# Patient Record
Sex: Female | Born: 1950 | Race: White | Hispanic: No | Smoking: Never smoker
Health system: Southern US, Community
[De-identification: ages and names within clinical notes are randomized; demographics above are authoritative.]

## PROBLEM LIST (undated history)

## (undated) DIAGNOSIS — D259 Leiomyoma of uterus, unspecified: Secondary | ICD-10-CM

## (undated) DIAGNOSIS — I10 Essential (primary) hypertension: Secondary | ICD-10-CM

## (undated) DIAGNOSIS — E039 Hypothyroidism, unspecified: Secondary | ICD-10-CM

## (undated) DIAGNOSIS — A630 Anogenital (venereal) warts: Secondary | ICD-10-CM

## (undated) DIAGNOSIS — R5383 Other fatigue: Secondary | ICD-10-CM

## (undated) DIAGNOSIS — E119 Type 2 diabetes mellitus without complications: Secondary | ICD-10-CM

## (undated) DIAGNOSIS — G4730 Sleep apnea, unspecified: Secondary | ICD-10-CM

## (undated) DIAGNOSIS — F988 Other specified behavioral and emotional disorders with onset usually occurring in childhood and adolescence: Secondary | ICD-10-CM

## (undated) HISTORY — DX: Essential (primary) hypertension: I10

## (undated) HISTORY — DX: Hypothyroidism, unspecified: E03.9

## (undated) HISTORY — DX: Other fatigue: R53.83

## (undated) HISTORY — DX: Sleep apnea, unspecified: G47.30

## (undated) HISTORY — DX: Type 2 diabetes mellitus without complications: E11.9

## (undated) HISTORY — DX: Other specified behavioral and emotional disorders with onset usually occurring in childhood and adolescence: F98.8

## (undated) HISTORY — PX: GALLBLADDER SURGERY: SHX652

## (undated) HISTORY — DX: Anogenital (venereal) warts: A63.0

## (undated) HISTORY — DX: Leiomyoma of uterus, unspecified: D25.9

---

## 1992-07-14 HISTORY — PX: MYOMECTOMY: SHX85

## 2001-01-22 ENCOUNTER — Other Ambulatory Visit: Admission: RE | Admit: 2001-01-22 | Discharge: 2001-01-22 | Payer: Self-pay | Admitting: Obstetrics and Gynecology

## 2002-01-24 ENCOUNTER — Other Ambulatory Visit: Admission: RE | Admit: 2002-01-24 | Discharge: 2002-01-24 | Payer: Self-pay | Admitting: Obstetrics and Gynecology

## 2003-01-27 ENCOUNTER — Other Ambulatory Visit: Admission: RE | Admit: 2003-01-27 | Discharge: 2003-01-27 | Payer: Self-pay | Admitting: Obstetrics and Gynecology

## 2005-02-26 ENCOUNTER — Other Ambulatory Visit: Admission: RE | Admit: 2005-02-26 | Discharge: 2005-02-26 | Payer: Self-pay | Admitting: Obstetrics and Gynecology

## 2006-10-22 ENCOUNTER — Ambulatory Visit (HOSPITAL_BASED_OUTPATIENT_CLINIC_OR_DEPARTMENT_OTHER): Admission: RE | Admit: 2006-10-22 | Discharge: 2006-10-22 | Payer: Self-pay | Admitting: Allergy

## 2006-10-25 ENCOUNTER — Ambulatory Visit: Payer: Self-pay | Admitting: Internal Medicine

## 2006-12-29 ENCOUNTER — Ambulatory Visit: Payer: Self-pay | Admitting: Internal Medicine

## 2007-01-26 ENCOUNTER — Ambulatory Visit (HOSPITAL_COMMUNITY): Admission: RE | Admit: 2007-01-26 | Discharge: 2007-01-26 | Payer: Self-pay | Admitting: Allergy

## 2007-02-09 ENCOUNTER — Ambulatory Visit: Payer: Self-pay | Admitting: Internal Medicine

## 2008-11-23 ENCOUNTER — Ambulatory Visit: Payer: Self-pay | Admitting: Family Medicine

## 2008-11-23 DIAGNOSIS — R5383 Other fatigue: Secondary | ICD-10-CM

## 2008-11-23 DIAGNOSIS — R5381 Other malaise: Secondary | ICD-10-CM

## 2008-12-19 ENCOUNTER — Encounter: Payer: Self-pay | Admitting: Family Medicine

## 2008-12-19 LAB — CONVERTED CEMR LAB
ALT: 27 units/L (ref 0–35)
BUN: 18 mg/dL (ref 6–23)
Basophils Relative: 1 % (ref 0–1)
CO2: 22 meq/L (ref 19–32)
Calcium: 9.3 mg/dL (ref 8.4–10.5)
Chloride: 104 meq/L (ref 96–112)
Cholesterol, target level: 200 mg/dL
Cholesterol: 204 mg/dL — ABNORMAL HIGH (ref 0–200)
Creatinine, Ser: 0.83 mg/dL (ref 0.40–1.20)
Eosinophils Absolute: 0.2 10*3/uL (ref 0.0–0.7)
Eosinophils Relative: 4 % (ref 0–5)
HCT: 40.7 % (ref 36.0–46.0)
HDL goal, serum: 40 mg/dL
HDL: 53 mg/dL (ref >39)
Iron: 124 ug/dL (ref 42–145)
LDL Goal: 160 mg/dL
Lymphs Abs: 2.3 10*3/uL (ref 0.7–4.0)
MCHC: 33.2 g/dL (ref 30.0–36.0)
MCV: 93.1 fL (ref 78.0–100.0)
Monocytes Relative: 9 % (ref 3–12)
Neutrophils Relative %: 43 % (ref 43–77)
Platelets: 267 10*3/uL (ref 150–400)
RBC: 4.37 M/uL (ref 3.87–5.11)
Total Bilirubin: 0.7 mg/dL (ref 0.3–1.2)
Total CHOL/HDL Ratio: 3.8
Triglycerides: 151 mg/dL — ABNORMAL HIGH (ref <150)
UIBC: 325 ug/dL
VLDL: 30 mg/dL (ref 0–40)
Vitamin B-12: >2000 pg/mL — ABNORMAL HIGH (ref 211–911)

## 2008-12-26 ENCOUNTER — Encounter: Payer: Self-pay | Admitting: Family Medicine

## 2009-01-02 ENCOUNTER — Encounter: Payer: Self-pay | Admitting: Family Medicine

## 2009-01-03 ENCOUNTER — Encounter: Payer: Self-pay | Admitting: Family Medicine

## 2009-01-04 ENCOUNTER — Encounter: Payer: Self-pay | Admitting: Family Medicine

## 2009-01-12 IMAGING — US US ABDOMEN COMPLETE
1 series · 14 of 25 positions shown · non-contrast
Comparison: none

CLINICAL DATA: Increased liver enzymes.  
ABDOMEN ULTRASOUND:
TECHNIQUE: Complete abdominal ultrasound examination was performed including evaluation of the liver, gallbladder, bile ducts, pancreas, kidneys, spleen, IVC, and abdominal aorta.

[Series 1: us abdomen complete · 0.29mm/px · 14 of 88 slices shown]
[im 1/88]
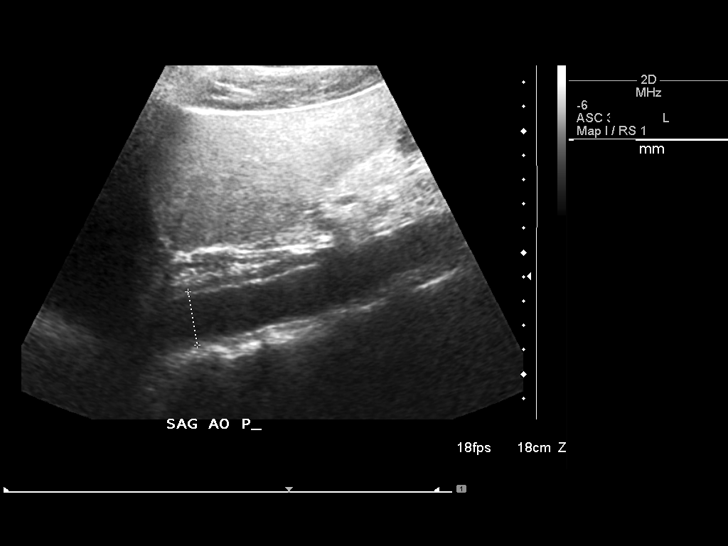
[im 8/88]
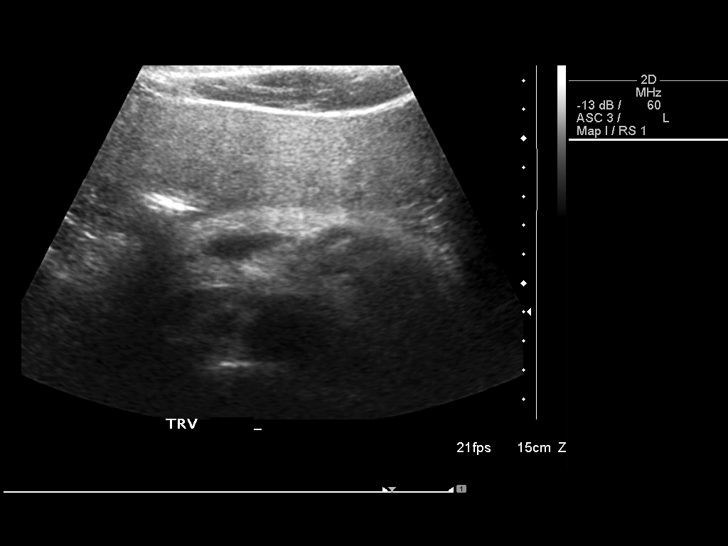
[im 15/88]
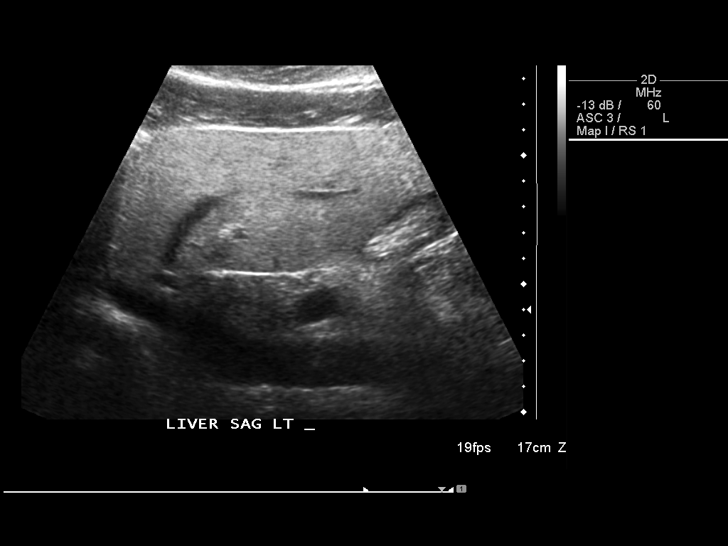
[im 22/88]
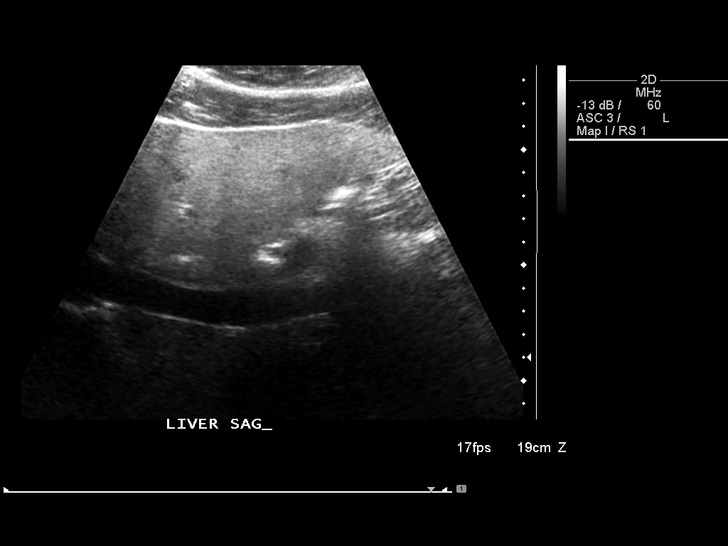
[im 30/88]
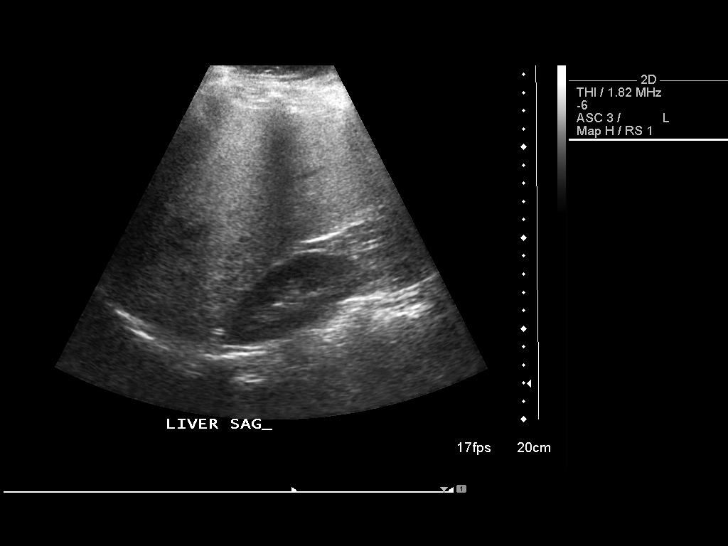
[im 33/88]
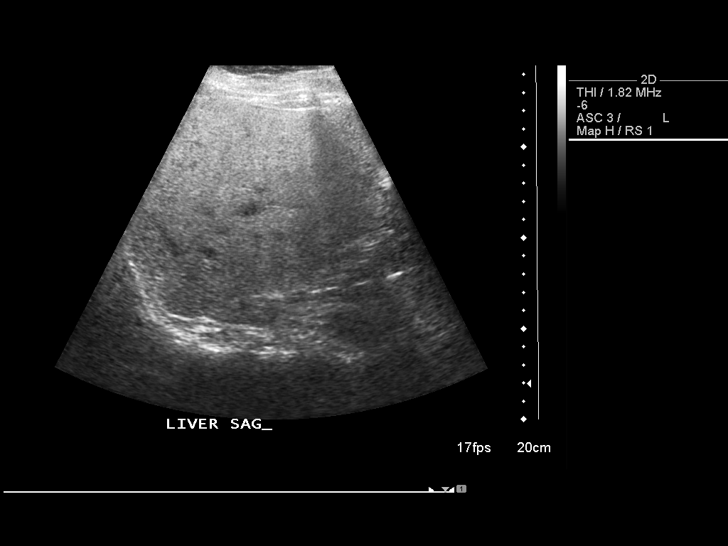
[im 40/88]
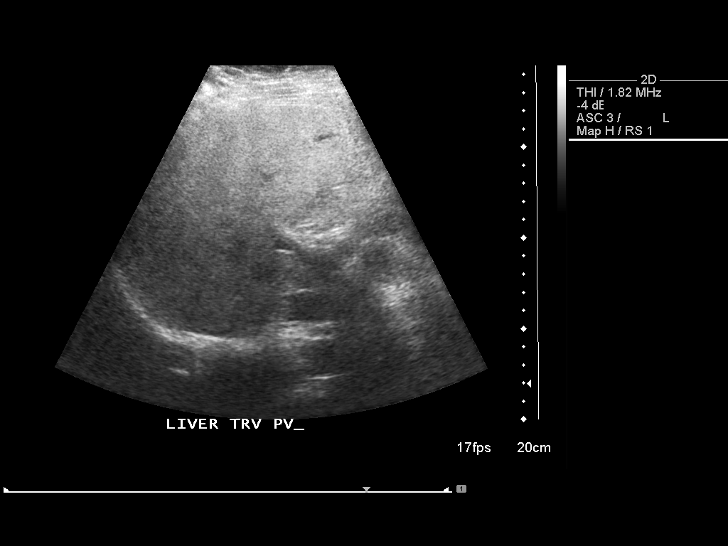
[im 48/88]
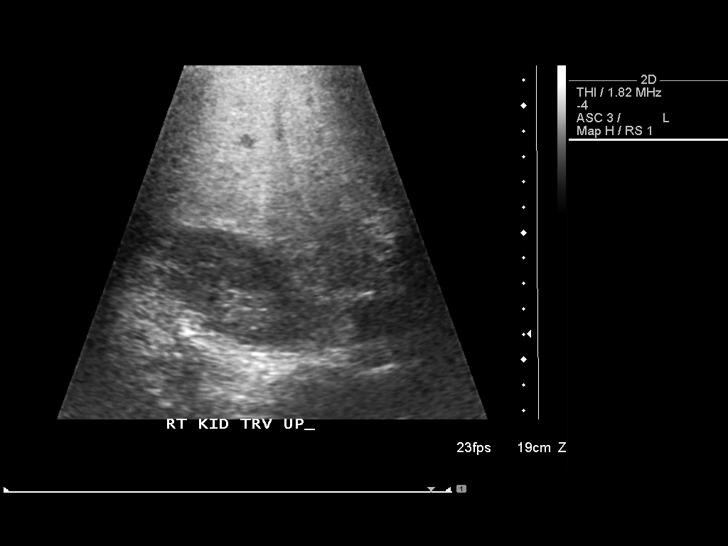
[im 55/88]
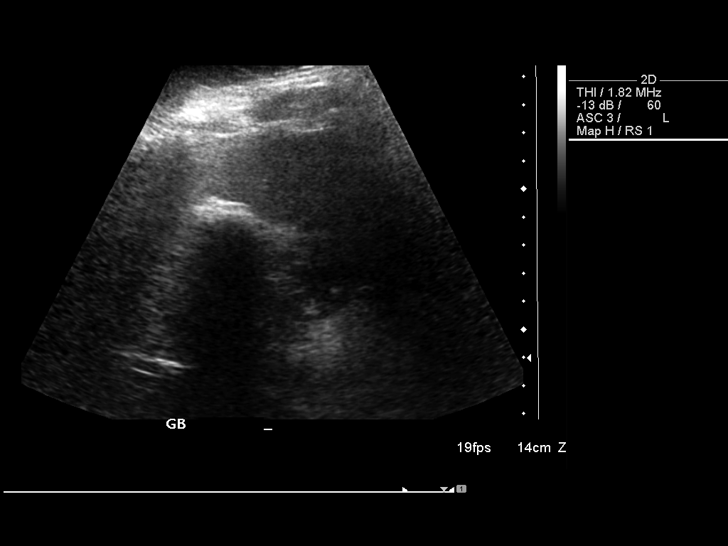
[im 59/88]
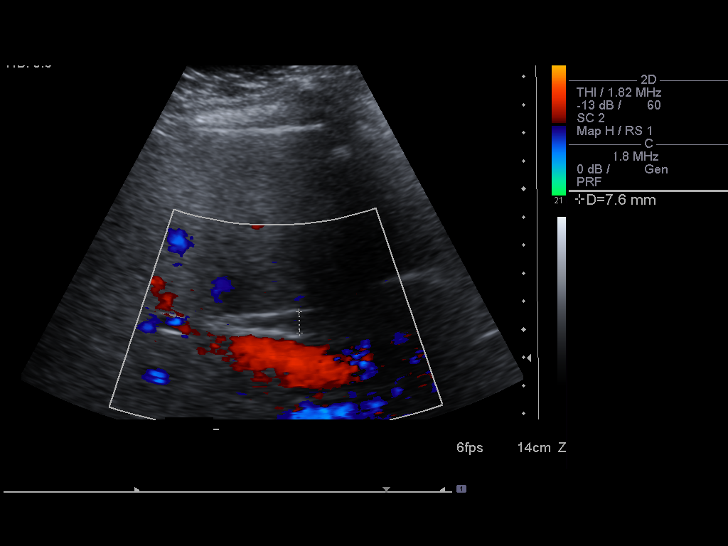
[im 66/88]
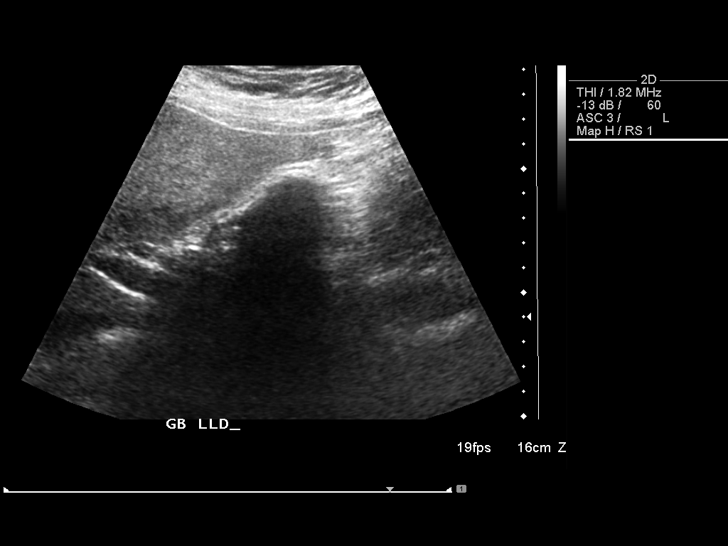
[im 73/88]
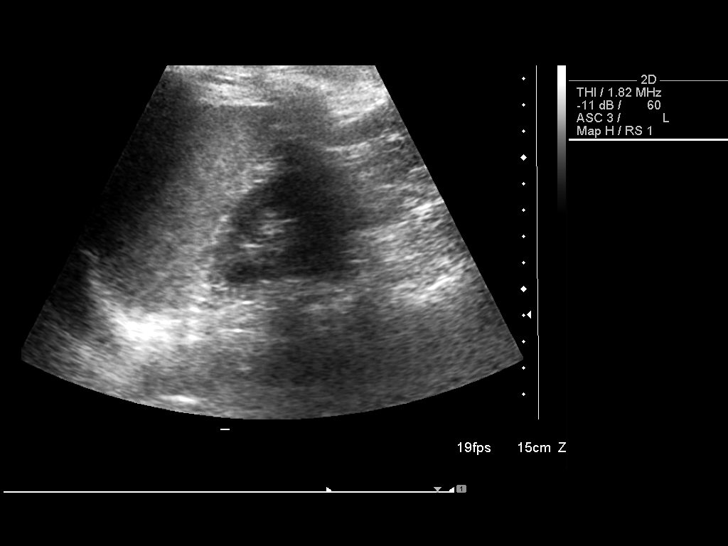
[im 80/88]
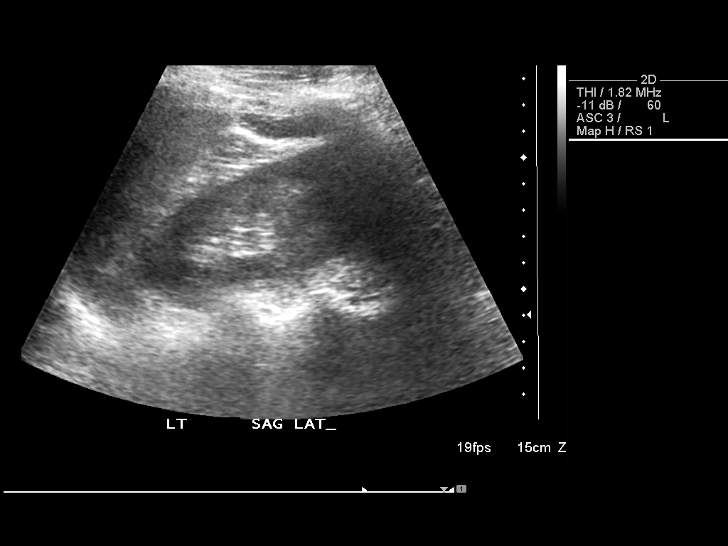
[im 88/88]
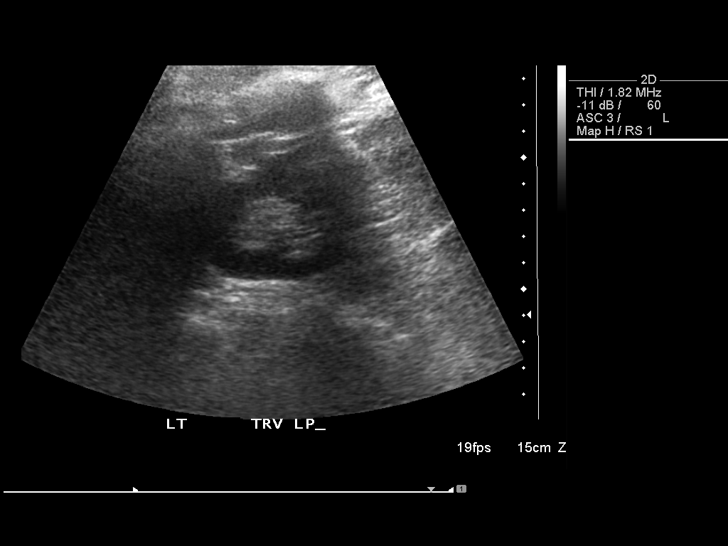

[14 of 25 positions shown; findings below may reference images not displayed]

FINDINGS: The gallbladder wall is echogenic, and there is pronounced posterior acoustic shadowing which is consistent with either a porcelain gallbladder or gallbladder filled with large calculi.  The common duct is at the very upper limits of normal measuring 7-8 mm in width.  No gross intrahepatic biliary ductal dilatation is seen.  The liver is diffusely echogenic, most consistent with fatty infiltration.  The visualized portions of the pancreas, abdominal aorta and IVC are unremarkable.  The spleen and both kidneys have a normal sonographic appearance.  There is no ascites.
IMPRESSION: 1.  ?Wall-echo-shadow? sign involving the gallbladder, consistent with porcelain gallbladder vs gallbladder lumen packed with large stones.  
2.  The common duct is at the very upper limits of normal in width, measuring 7-8 mm.
3.  Diffuse fatty infiltration of the liver.

## 2009-01-12 IMAGING — US US PELVIS COMPLETE MODIFY
1 series · 14 of 25 positions shown · non-contrast
Comparison: none

CLINICAL DATA: Fibroids.  History of myomectomy.  
 TRANSABDOMINAL AND TRANSVAGINAL PELVIC ULTRASOUND:
TECHNIQUE: Both transabdominal and transvaginal ultrasound examinations of the pelvis were performed including evaluation of the uterus, ovaries, adnexal regions, and pelvic cul-de-sac.

[Series 1: us pelvis complete modify · 0.31mm/px · 14 of 31 slices shown]
[im 1/31]
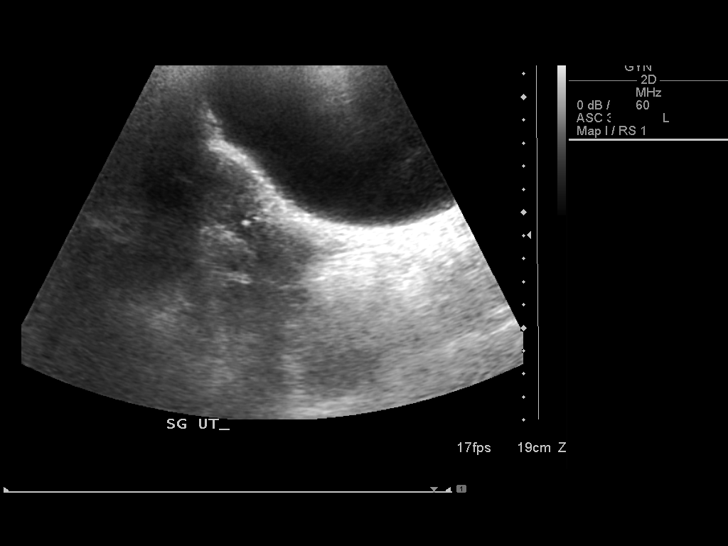
[im 3/31]
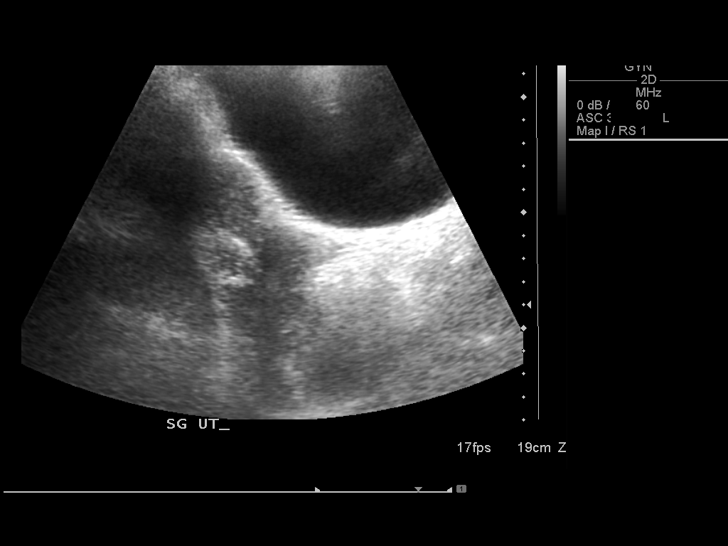
[im 6/31]
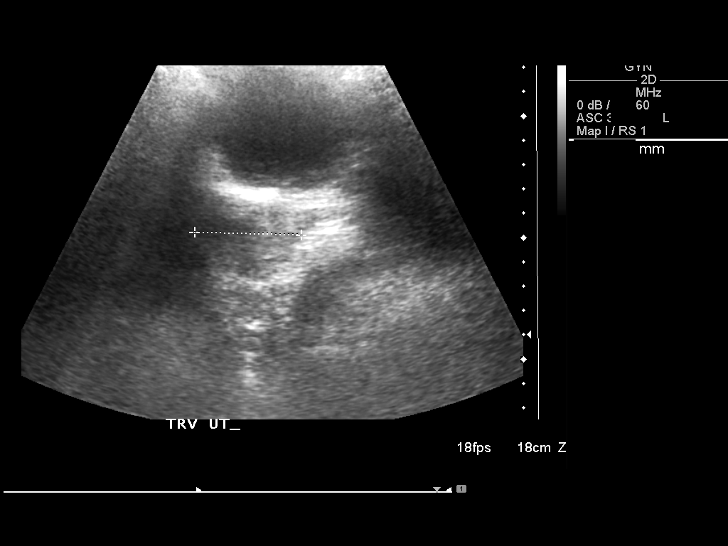
[im 8/31]
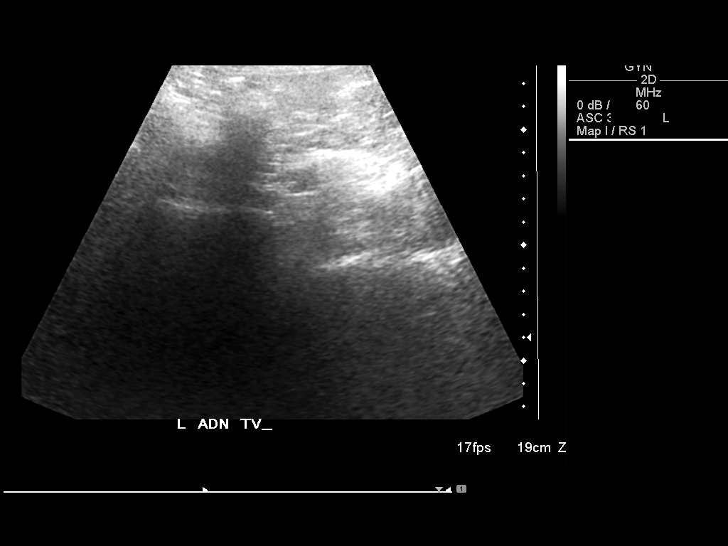
[im 11/31]
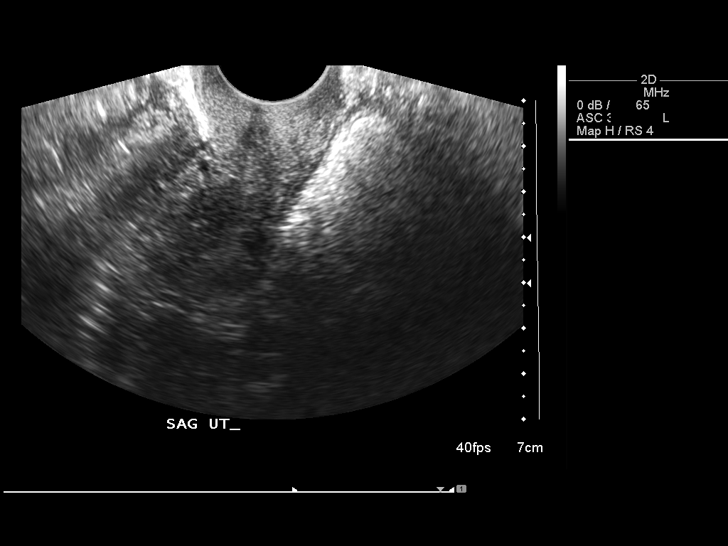
[im 12/31]
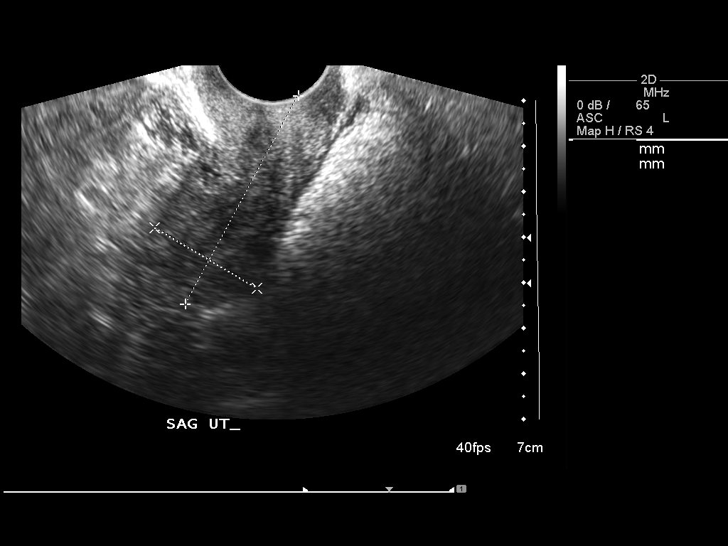
[im 14/31]
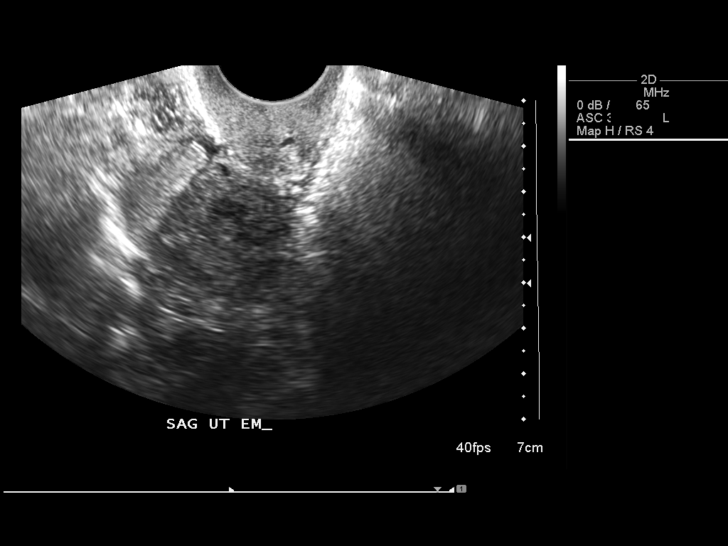
[im 17/31]
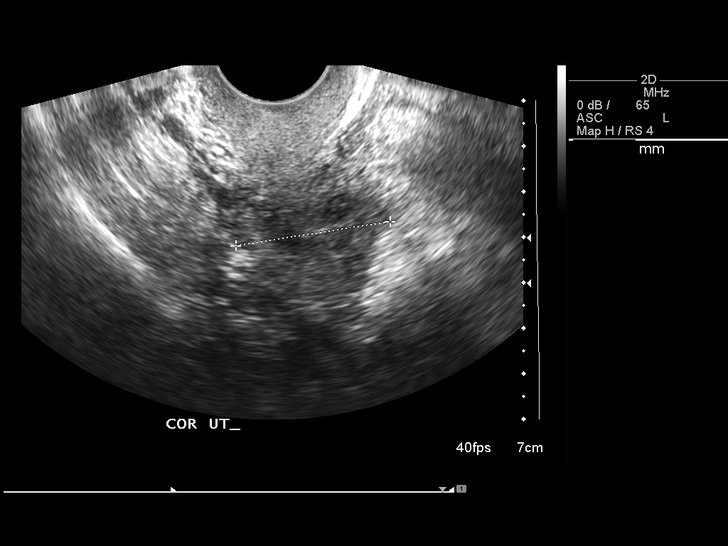
[im 19/31]
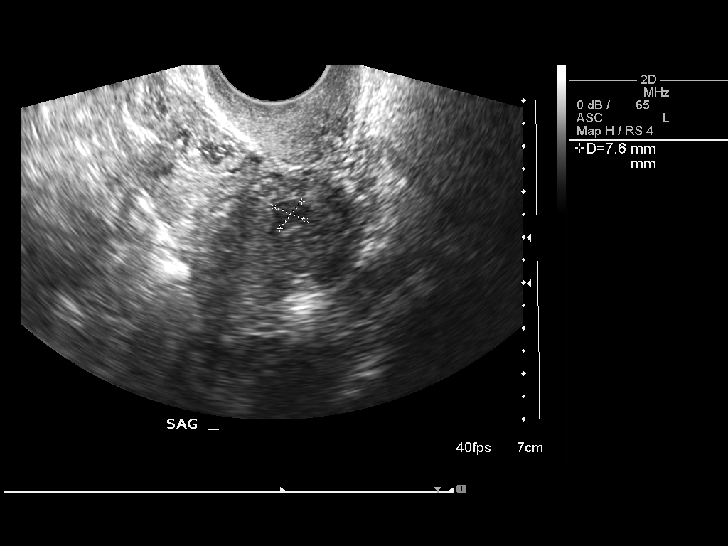
[im 21/31]
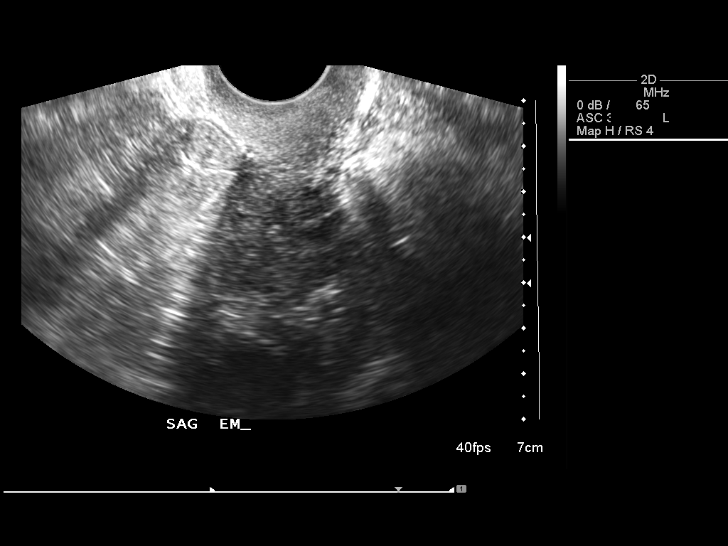
[im 23/31]
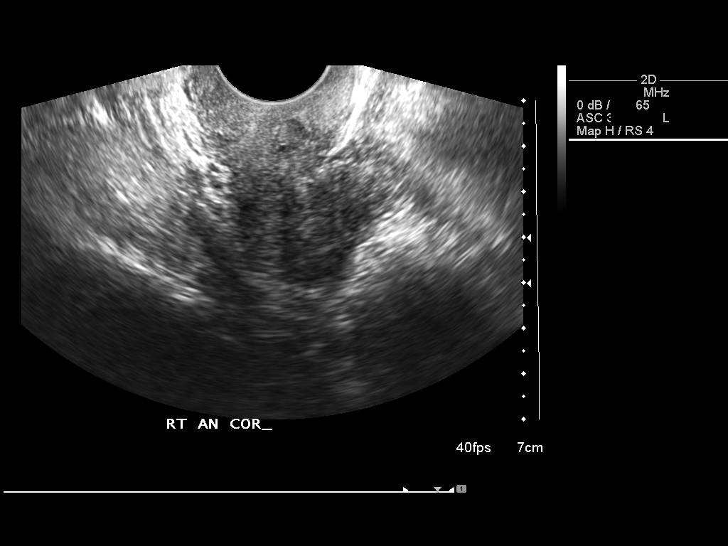
[im 26/31]
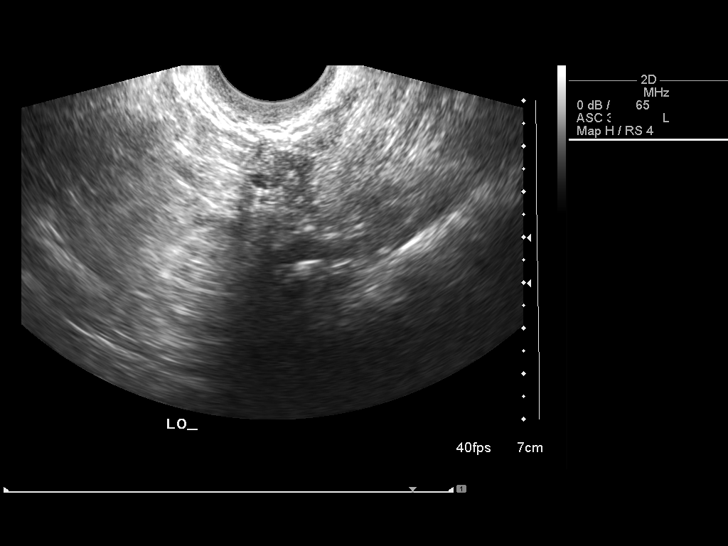
[im 28/31]
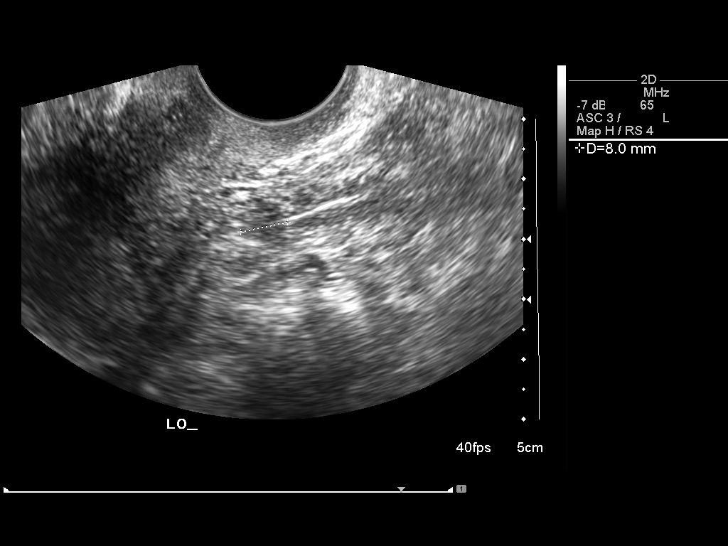
[im 31/31]
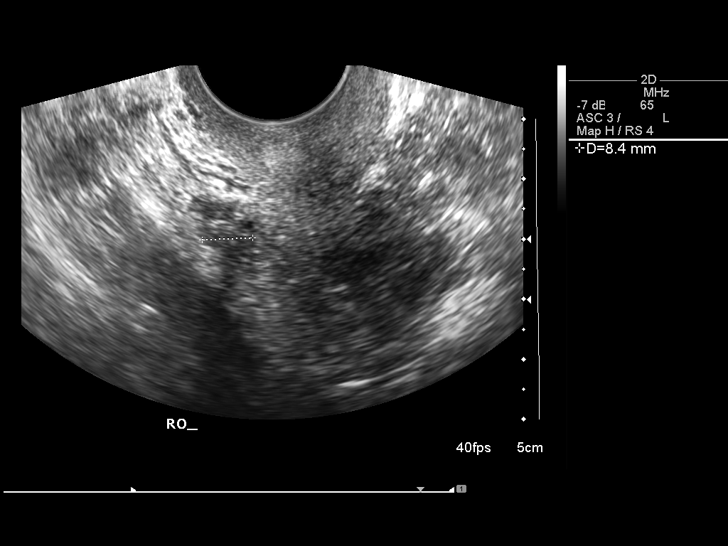

[14 of 25 positions shown; findings below may reference images not displayed]

FINDINGS: The uterus is normal in size measuring 5.2 x 2.6 x 3.4 cm.  There is a 9 mm fibroid at the lower uterine segment.  The endometrial stripe is within normal limits measuring 2-3 mm in width.  Both ovaries have a normal size, shape and echotexture.  The right ovary measures 1.4 x 0.7 x 0.8 cm.  The left ovary measures 1.3 x 0.9 x 0.8 cm.  No adnexal masses or free pelvic fluid are seen.
IMPRESSION: Normal pelvic ultrasound.

## 2009-01-16 ENCOUNTER — Encounter: Payer: Self-pay | Admitting: Family Medicine

## 2009-05-03 ENCOUNTER — Telehealth: Payer: Self-pay | Admitting: Family Medicine

## 2010-04-30 ENCOUNTER — Ambulatory Visit (HOSPITAL_COMMUNITY): Admission: RE | Admit: 2010-04-30 | Discharge: 2010-04-30 | Payer: Self-pay | Admitting: Obstetrics and Gynecology

## 2010-11-26 NOTE — Assessment & Plan Note (Signed)
[facility]                             PULMONARY OFFICE NOTE   PRESLYNN, BIER                 MRN:          [id number]  DATE:02/09/2007                            DOB:          1950/10/06    PROBLEM LIST:  1. Obstructive sleep apnea.  2. History of polycythemia.  3. Rhinitis.   HISTORY:  She has not yet turned in her auto titration chip for CPAP  adjustment, and says that her nasal pillows mask was suffocating her.  She has not been following closely with [facility], saying  that she is not often home.  What she sees of her pressures has been  ranging between 5 and 6 CWP, which is pretty low but makes her ears  hurt.  She has been given Ambien by her primary physician, or sometimes  uses melatonin.   MEDICATIONS:  1. Vitamins.  2. Hormone cream.  3. Armour thyroid.  4. Temazepam, which she has used up.  5. Ambien, unknown dose.   OBJECTIVE:  Weight 195 pounds, BP 130/76, pulse 77, room air saturation  96%.  Somewhat pressured speech.  Tympanic membranes look normal.  Throat is not red.  Voice quality is  normal.  Breathing is unlabored.  There is no neck vein distention, no significant nasal congestion.  Heart sounds are regular without murmur.   IMPRESSION:  1. Obstructive sleep apnea.  2. Anxiety component.   PLAN:  Reassurance, discussion of CPAP comfort issues.  Discussion of  oral appliance as an alternative to CPAP.  She is given contact  information to learn more about oral appliances.  Schedule return 3  months, earlier p.r.n.  Meanwhile, work on sleep hygiene as discussed.     Clinton D. Maple Hudson, MD, [facility], FACP  Electronically Signed   CDY/MedQ  DD: 02/09/2007  DT: 02/10/2007  Job #: [id number]   cc:   Paulita Fujita, M.D.

## 2010-11-26 NOTE — Assessment & Plan Note (Signed)
[facility]                             PULMONARY OFFICE NOTE   Michelle, Dyer                    MRN:          [id number]  DATE:12/29/2006                            DOB:          1950-12-08    PROBLEM:  A 60 year old woman referred through the courtesy of Dr.  Zenda Alpers complaining of poor nonrestorative sleep.   HISTORY:  She has been aware of daytime tiredness in the last few years.  She feels she is menopausal.  She drinks caffeine through the day in  order to stay awake and be normal.  A nocturnal polysomnogram at the  [facility] on October 22, 2006 showed moderately severe  obstructive apnea with an index of 39.6 per hour, mild to moderate  snoring, and oxygen desaturation to 77%.  She is divorced, but has been  told by her husband when married that she did snore some.  She complains  that she is too tired and does not have the energy to start dating  again.  She is aware of gaining weight.   MEDICATIONS:  Vitamins, hormone cream, Armour thyroid.   NO MEDICATION ALLERGY.   REVIEW OF SYSTEMS:  Weight gain.  Some snoring.  Mild seasonal nasal  congestion.   PAST MEDICAL HISTORY:  1. Septoplasty and submucosal resection of turbinates.  2. Uterine surgery for fibroids.  3. Cholelithiasis, which she says she resolved by flushing her liver      with olive oil.   SOCIAL HISTORY:  Nonsmoker.  Wine 2 or 3 times a week.  She is divorced.  Works as a Lobbyist.  She had moved in to this area  around 2002.  Lives with 2 dogs.   FAMILY HISTORY:  Brother with bad sleep apnea.   OBJECTIVE:  Weight 194 pounds.  BP 142/82.  Pulse 57.  Room air  saturation 98%.  She is somewhat overweight, alert, pleasant.  HEENT:  Palate spacing is 2 to 3/4.  Voice quality normal.  There is  some mucus bridging in her nose, and deviated septum, but it is not  badly obstructed.  Thyroid is not enlarged.  There is no stridor.  LUNG  FIELDS:  Clear.  HEART:  Sounds regular without murmur.  EXTREMITIES:  Without tremor, cyanosis, or clubbing.  Pulmonary function test was done at [facility] on April 11,  and is within normal limits.  There is a slight improvement in small  airway flows after bronchodilator of doubtful significance.   IMPRESSION:  1. Obstructive sleep apnea.  2. Incidental history not referred to above, of polycythemia.  I am      concerned about her nocturnal oxygenation, not supported by the      results of either her pulmonary function testing or her sleep      study.   PLAN:  1. We are going to titrate CPAP for trial.  2. Temazepam 15 mg 1 or 2 nightly to help while she adjusts to sleep      CPAP in the 1st week or 2.  3. We have discussed the physiology and  available therapies for sleep      apnea, emphasizing her responsibility to keep her weight down, and      to drive safely.  4. Schedule return 1 month, earlier p.r.n.     Clinton D. Maple Hudson, MD, [facility]oll[facility], FACP  Electronically Signed    CDY/MedQ  DD: 12/29/2006  DT: 12/30/2006  Job #: [id number]   cc:   Paulita Fujita, M.D.

## 2010-11-29 NOTE — Procedures (Signed)
Michelle Dyer, Michelle Dyer           ACCOUNT NO.:  [id number]   MEDICAL RECORD NO.:  [id number]          PATIENT TYPE:  OUT   LOCATION:  SLEEP CENTER                 FACILITY:  [facility]   PHYSICIAN:  Clinton D. Maple Hudson, MD, FCCP, FACPDATE OF BIRTH:  08/21/1949   DATE OF STUDY:  10/22/2006                            NOCTURNAL POLYSOMNOGRAM   REFERRING PHYSICIAN:  Archie Balboa   INDICATION FOR STUDY:  Hypersomnia with sleep apnea.   EPWORTH SLEEPINESS SCORE:  Is 9/24.  BMI 33.4.  Weight 201 pounds.  Diagnostic NPSG protocol requested.   MEDICATIONS:  Listed and reviewed.   SLEEP ARCHITECTURE:  Total sleep time 306 minutes with sleep efficiency  79%.  Stage 1 was 4%, stage 2 was 64%, stages 3 and 4 12%, REM 17% of  total sleep time.  Sleep latency 12 minutes.  REM latency 76 minutes.  Awake after sleep onset 66 minutes.  Arousal index 18.  No bedtime  medication was taken.   RESPIRATORY DATA:  Apnea/hypopnea index (AHI RDI):  39.6 obstructive  events per hour, indicating moderately severe obstructive sleep  apnea/hypopnea syndrome.  There were seven obstructive apneas and 195  hypopneas.  Events were not positional.  REM AHI 95.1.   OXYGEN DATA:  Mild to moderate intermittent snoring with oxygen  desaturation to a Nadir of 77%.  Mean oxygen saturation through the  study was 94% on room air.   CARDIAC DATA:  Normal sinus rhythm.   MOVEMENT/PARASOMNIA:  Occasional limb jerk, insignificant.   IMPRESSIONS-RECOMMENDATIONS:  1. Moderately severe obstructive sleep apnea/hypopnea syndrome,      apnea/hypopnea index 39.6 per hour with non-positional events, mild      to moderate snoring and oxygen desaturation to a Nadir of 77%.  2. Consider return for CPAP titration or evaluate for alternative      therapies as appropriate.     Clinton D. Maple Hudson, MD, [facility], FACP  Diplomate, Biomedical engineer of Sleep Medicine  Electronically Signed    CDY/MEDQ  D:  10/25/2006 10:06:03  T:  10/25/2006  11:10:15  Job:  [id number]

## 2011-10-03 DIAGNOSIS — L282 Other prurigo: Secondary | ICD-10-CM | POA: Insufficient documentation

## 2011-10-03 DIAGNOSIS — L821 Other seborrheic keratosis: Secondary | ICD-10-CM | POA: Insufficient documentation

## 2011-12-05 DIAGNOSIS — L219 Seborrheic dermatitis, unspecified: Secondary | ICD-10-CM | POA: Insufficient documentation

## 2015-10-25 ENCOUNTER — Ambulatory Visit (INDEPENDENT_AMBULATORY_CARE_PROVIDER_SITE_OTHER): Payer: Medicare Other | Admitting: Obstetrics & Gynecology

## 2015-10-25 ENCOUNTER — Encounter: Payer: Self-pay | Admitting: Obstetrics & Gynecology

## 2015-10-25 VITALS — BP 144/74 | HR 68 | Resp 16 | Ht 66.0 in | Wt 189.0 lb

## 2015-10-25 DIAGNOSIS — Z01419 Encounter for gynecological examination (general) (routine) without abnormal findings: Secondary | ICD-10-CM | POA: Diagnosis not present

## 2015-10-25 DIAGNOSIS — N9089 Other specified noninflammatory disorders of vulva and perineum: Secondary | ICD-10-CM

## 2015-10-25 NOTE — Progress Notes (Signed)
Subjective:    Michelle Dyer is a 65 y.o. female who presents for an annual exam. The patient has no complaints today. The patient is not sexually active. GYN screening history: last pap: was normal. The patient wears seatbelts: yes. The patient participates in regular exercise: not asked. Has the patient ever been transfused or tattooed?: no. The patient reports that there is not domestic violence in her life.   Menstrual History: OB History    Gravida Para Term Preterm AB TAB SAB Ectopic Multiple Living   0 0 0 0 0 0 0 0 0 0        No LMP recorded. Patient is postmenopausal.    The following portions of the patient's history were reviewed and updated as appropriate: allergies, current medications, past family history, past medical history, past social history, past surgical history and problem list.  Review of Systems Pertinent items are noted in HPI.    Objective:    BP 144/74 mmHg  Pulse 68  Resp 16  Ht 5\' 6"  (1.676 m)  Wt 189 lb (85.73 kg)  BMI 30.52 kg/m2  General Appearance:    Alert, cooperative, no distress, appears stated age  Head:    Normocephalic, without obvious abnormality, atraumatic  Eyes:    PERRL, conjunctiva/corneas clear, EOM's intact, fundi    benign, both eyes  Ears:    Normal TM's and external ear canals, both ears  Nose:   Nares normal, septum midline, mucosa normal, no drainage    or sinus tenderness  Throat:   Lips, mucosa, and tongue normal; teeth and gums normal  Neck:   Supple, symmetrical, trachea midline, no adenopathy;    thyroid:  no enlargement/tenderness/nodules; no carotid   bruit or JVD  Back:     Symmetric, no curvature, ROM normal, no CVA tenderness  Lungs:     Clear to auscultation bilaterally, respirations unlabored  Chest Wall:    No tenderness or deformity   Heart:    Regular rate and rhythm, S1 and S2 normal, no murmur, rub   or gallop  Breast Exam:    No tenderness, masses, or nipple abnormality  Abdomen:     Soft,  non-tender, bowel sounds active all four quadrants,    no masses, no organomegaly  Genitalia:    Normal female without lesion, discharge or tenderness, thickened bit of skin on her upper labia majora (She has seen a dermatologist and Dr. Willis Modena about this. She is wanting a biopsy.) I biopsied the area after cleaning it and injecting lidocaine. Silver nitrate was used on the 62mm punch biopsy site to yield hemostasis. She tolerated the procedure well.   There were no palpable masses with bimanual exam  Extremities:   Extremities normal, atraumatic, no cyanosis or edema  Pulses:   2+ and symmetric all extremities  Skin:   Skin color, texture, turgor normal, no rashes or lesions  Lymph nodes:   Cervical, supraclavicular, and axillary nodes normal  Neurologic:   CNII-XII intact, normal strength, sensation and reflexes    throughout  .    Assessment:    Healthy female exam.    Plan:     Mammogram. rec'd Pap not done due to ACOG recs Await biopsy results

## 2015-11-01 ENCOUNTER — Telehealth: Payer: Self-pay | Admitting: *Deleted

## 2015-11-01 MED ORDER — CLOBETASOL PROPIONATE 0.05 % EX OINT: TOPICAL_OINTMENT | CUTANEOUS | Status: AC

## 2015-11-01 NOTE — Telephone Encounter (Signed)
Pt notified of test results and RX sent to [facility]

## 2015-11-01 NOTE — Telephone Encounter (Signed)
-----   Message from Emily Filbert, MD sent at 10/30/2015  [id number] PM EDT ----- Lichen simplex, not sclerosis, but the treatment is the same

## 2018-07-01 LAB — HM MAMMOGRAPHY

## 2018-07-26 LAB — HM DEXA SCAN: HM Dexa Scan: NORMAL

## 2018-08-17 LAB — HM COLONOSCOPY

## 2020-05-22 ENCOUNTER — Encounter: Payer: Self-pay | Admitting: Family Medicine

## 2020-05-22 ENCOUNTER — Ambulatory Visit (INDEPENDENT_AMBULATORY_CARE_PROVIDER_SITE_OTHER): Payer: Medicare Other | Admitting: Family Medicine

## 2020-05-22 ENCOUNTER — Other Ambulatory Visit: Payer: Self-pay | Admitting: Family Medicine

## 2020-05-22 VITALS — BP 158/79 | HR 91 | Temp 98.2°F | Ht 63.78 in | Wt 186.4 lb

## 2020-05-22 DIAGNOSIS — R0609 Other forms of dyspnea: Secondary | ICD-10-CM

## 2020-05-22 DIAGNOSIS — I152 Hypertension secondary to endocrine disorders: Secondary | ICD-10-CM

## 2020-05-22 DIAGNOSIS — E119 Type 2 diabetes mellitus without complications: Secondary | ICD-10-CM | POA: Diagnosis not present

## 2020-05-22 DIAGNOSIS — E039 Hypothyroidism, unspecified: Secondary | ICD-10-CM | POA: Diagnosis not present

## 2020-05-22 DIAGNOSIS — Z20822 Contact with and (suspected) exposure to covid-19: Secondary | ICD-10-CM | POA: Diagnosis not present

## 2020-05-22 DIAGNOSIS — E1159 Type 2 diabetes mellitus with other circulatory complications: Secondary | ICD-10-CM | POA: Diagnosis not present

## 2020-05-22 DIAGNOSIS — R0600 Dyspnea, unspecified: Secondary | ICD-10-CM | POA: Diagnosis not present

## 2020-05-22 NOTE — Assessment & Plan Note (Signed)
EKG completed and normal today.  Update labs Discussed referral to cardiology as well.  Will await labs to be sure there aren't any other causes to explain her symptoms.

## 2020-05-22 NOTE — Progress Notes (Signed)
Michelle Dyer - 69 y.o. female MRN [id number]  Date of birth: September 11, 1950  Subjective Chief Complaint  Patient presents with  . Fatigue    HPI Michelle Dyer is a 69 y.o. female here today for initial visit.  She reports history of hypothyroidism, HTN, T2DM.  Her main concern today is fatigue and exertional dyspnea.   She is currently seeing an integrative medicine provider who is managing thyroid medications.  She is also getting hormone pellets.  She thinks these contain progesterone and testosterone.   She is also taking naltrexone.  A previous physician told her she should take this for inflammation.  She has really felt any different since starting this and reports the only reason she is taking it is because they keep prescribing it.  Her HTN is currently treated with lisinopril 20mg  daily.  She has been prescribed ozempic as well for management of her diabetes and weight.  She is not monitoring her BP or blood sugars at home.  She does report a history of OSA, previously prescribed CPAP but could not tolerate.  She has had increased dyspnea and fatigue with exertional activities. This typically improves with rest.  She denies chest pain, palpitations, or dizziness.     ROS:  A comprehensive ROS was completed and negative except as noted per HPI   No Known Allergies  Past Medical History:  Diagnosis Date  . ADD (attention deficit disorder)   . Diabetes mellitus without complication ([place])   . Fatigue   . Genital warts   . Hypertension   . Hypothyroid   . Uterine fibroid     Past Surgical History:  Procedure Laterality Date  . GALLBLADDER SURGERY    . MYOMECTOMY  1994    Social History   Socioeconomic History  . Marital status: Divorced    Spouse name: Not on file  . Number of children: Not on file  . Years of education: Not on file  . Highest education level: Not on file  Occupational History  . Occupation: retired  Tobacco Use  . Smoking status: Never  Smoker  . Smokeless tobacco: Never Used  Vaping Use  . Vaping Use: Never used  Substance and Sexual Activity  . Alcohol use: No    Alcohol/week: 0.0 standard drinks  . Drug use: No  . Sexual activity: Not Currently    Partners: Male    Birth control/protection: Abstinence  Other Topics Concern  . Not on file  Social History Narrative  . Not on file   Social Determinants of Health   Financial Resource Strain:   . Difficulty of Paying Living Expenses: Not on file  Food Insecurity:   . Worried About Charity fundraiser in the Last Year: Not on file  . Ran Out of Food in the Last Year: Not on file  Transportation Needs:   . Lack of Transportation (Medical): Not on file  . Lack of Transportation (Non-Medical): Not on file  Physical Activity:   . Days of Exercise per Week: Not on file  . Minutes of Exercise per Session: Not on file  Stress:   . Feeling of Stress : Not on file  Social Connections:   . Frequency of Communication with Friends and Family: Not on file  . Frequency of Social Gatherings with Friends and Family: Not on file  . Attends Religious Services: Not on file  . Active Member of Clubs or Organizations: Not on file  . Attends Archivist Meetings:  Not on file  . Marital Status: Not on file    Family History  Problem Relation Age of Onset  . Stroke Mother   . Hypertension Mother   . Kidney disease Mother   . Heart disease Father        pacemaker  . Diabetes Father     Health Maintenance  Topic Date Due  . Hepatitis C Screening  Never done  . INFLUENZA VACCINE  10/11/2020 (Originally 02/12/2020)  . COVID-19 Vaccine (1) 06/07/2021 (Originally 10/16/1961)  . MAMMOGRAM  07/01/2020  . TETANUS/TDAP  08/21/2025  . COLONOSCOPY  08/17/2028  . DEXA SCAN  Completed  . PNA vac Low Risk Adult  Completed      ----------------------------------------------------------------------------------------------------------------------------------------------------------------------------------------------------------------- Physical Exam BP (!) 158/79 (BP Location: Left Arm, Patient Position: Sitting, Cuff Size: Normal)   Pulse 91   Temp 98.2 F (36.8 C)   Ht 5' 3.78" (1.62 m)   Wt 186 lb 6.4 oz (84.6 kg)   SpO2 99%   BMI 32.22 kg/m   Physical Exam Constitutional:      Appearance: Normal appearance.  HENT:     Head: Normocephalic and atraumatic.  Eyes:     General: No scleral icterus. Cardiovascular:     Rate and Rhythm: Normal rate and regular rhythm.  Pulmonary:     Effort: Pulmonary effort is normal.     Breath sounds: Normal breath sounds.  Musculoskeletal:     Cervical back: Neck supple.  Neurological:     General: No focal deficit present.     Mental Status: She is alert.  Psychiatric:        Mood and Affect: Mood normal.        Behavior: Behavior normal.    EKG interpretation:  NSR.  Normal axis. Normal PR and QTc  ------------------------------------------------------------------------------------------------------------------------------------------------------------------------------------------------------------------- Assessment and Plan  Close exposure to COVID-19 virus She thinks she may have had COVID previously and would like to have antibody testing.    Exertional dyspnea EKG completed and normal today.  Update labs Discussed referral to cardiology as well.  Will await labs to be sure there aren't any other causes to explain her symptoms.   Type 2 diabetes mellitus without complication, without long-term current use of insulin (HCC) She is currently taking ozempic, tolerating well.  Update a1c  Hypothyroidism Taking armour thyroid as well as cytomel.  Update TSH, FT4 and T3  Hypertension associated with diabetes ([place]) BP remains elevated.  Increase  lisinopril to 40mg  daily.    No orders of the defined types were placed in this encounter.  Orders Placed This Encounter  Procedures  . SARS CoV2 Serology(COVID19) AB(IgG,IgM),Immunoassay    Order Specific Question:   Is this test for diagnosis or screening    Answer:   Screening    Order Specific Question:   Symptomatic for COVID-19 as defined by CDC    Answer:   No    Order Specific Question:   Hospitalized for COVID-19    Answer:   No    Order Specific Question:   Admitted to ICU for COVID-19    Answer:   No    Order Specific Question:   Previously tested for COVID-19    Answer:   No    Order Specific Question:   Resident in a congregate (group) care setting    Answer:   No    Order Specific Question:   Employed in healthcare setting    Answer:   No    Order Specific  Question:   Pregnant    Answer:   No    Order Specific Question:   Patient Ethnicity:    Answer:   Caucasian  . TSH  . T3  . T4, free  . COMPLETE METABOLIC PANEL WITH GFR  . CBC  . HgB A1c  . EKG 12-Lead     No follow-ups on file.    This visit occurred during the SARS-CoV-2 public health emergency.  Safety protocols were in place, including screening questions prior to the visit, additional usage of staff PPE, and extensive cleaning of exam room while observing appropriate contact time as indicated for disinfecting solutions.

## 2020-05-22 NOTE — Patient Instructions (Addendum)
Nice to meet you today.  Please have labs completed.  I would recommend increasing your lisinopril to 40mg  daily.  Please follow a low sodium diet as well, and continue to work on weight loss.   I would recommend that you have a repeat sleep study- Untreated sleep apnea can lead to uncontrolled hypertension, fatigue, heart disease and stroke.  Hormone replacement and thyroid medications won't correct the fatigue associated with this.

## 2020-05-22 NOTE — Assessment & Plan Note (Signed)
BP remains elevated.  Increase lisinopril to 40mg  daily.

## 2020-05-22 NOTE — Assessment & Plan Note (Signed)
She is currently taking ozempic, tolerating well.  Update a1c

## 2020-05-22 NOTE — Assessment & Plan Note (Signed)
Taking armour thyroid as well as cytomel.  Update TSH, FT4 and T3

## 2020-05-22 NOTE — Assessment & Plan Note (Signed)
She thinks she may have had COVID previously and would like to have antibody testing.

## 2020-05-23 LAB — COMPLETE METABOLIC PANEL WITH GFR
AG Ratio: 1.6 (calc) (ref 1.0–2.5)
ALT: 26 U/L (ref 6–29)
AST: 21 U/L (ref 10–35)
Albumin: 4.4 g/dL (ref 3.6–5.1)
Alkaline phosphatase (APISO): 64 U/L (ref 37–153)
BUN: 15 mg/dL (ref 7–25)
CO2: 26 mmol/L (ref 20–32)
Calcium: 9.6 mg/dL (ref 8.6–10.4)
Chloride: 101 mmol/L (ref 98–110)
Creat: 0.86 mg/dL (ref 0.60–0.93)
GFR, Est African American: 79 mL/min/{1.73_m2} (ref >=60)
GFR, Est Non African American: 68 mL/min/{1.73_m2} (ref >=60)
Globulin: 2.8 g/dL (calc) (ref 1.9–3.7)
Glucose, Bld: 111 mg/dL — ABNORMAL HIGH (ref 65–99)
Potassium: 4.6 mmol/L (ref 3.5–5.3)
Sodium: 139 mmol/L (ref 135–146)
Total Bilirubin: 0.6 mg/dL (ref 0.2–1.2)
Total Protein: 7.2 g/dL (ref 6.1–8.1)

## 2020-05-23 LAB — CBC
HCT: 45.7 % — ABNORMAL HIGH (ref 35.0–45.0)
Hemoglobin: 15.8 g/dL — ABNORMAL HIGH (ref 11.7–15.5)
MCH: 32 pg (ref 27.0–33.0)
MCHC: 34.6 g/dL (ref 32.0–36.0)
MCV: 92.7 fL (ref 80.0–100.0)
MPV: 9 fL (ref 7.5–12.5)
Platelets: 316 10*3/uL (ref 140–400)
RBC: 4.93 10*6/uL (ref 3.80–5.10)
RDW: 12 % (ref 11.0–15.0)
WBC: 7.1 10*3/uL (ref 3.8–10.8)

## 2020-05-23 LAB — HEMOGLOBIN A1C
Hgb A1c MFr Bld: 5.4 % of total Hgb (ref <5.7)
Mean Plasma Glucose: 108 (calc)
eAG (mmol/L): 6 (calc)

## 2020-05-23 LAB — SARS COV-2 SEROLOGY(COVID-19)AB(IGG,IGM),IMMUNOASSAY
SARS CoV-2 AB IgG: NEGATIVE
SARS CoV-2 IgM: NEGATIVE

## 2020-05-23 LAB — T4, FREE: Free T4: 1.2 ng/dL (ref 0.8–1.8)

## 2020-05-23 LAB — TSH: TSH: 1.03 mIU/L (ref 0.40–4.50)

## 2020-05-23 LAB — T3: T3, Total: 181 ng/dL (ref 76–181)

## 2020-05-28 ENCOUNTER — Other Ambulatory Visit: Payer: Self-pay

## 2020-05-28 DIAGNOSIS — I152 Hypertension secondary to endocrine disorders: Secondary | ICD-10-CM

## 2020-05-28 MED ORDER — LISINOPRIL 20 MG PO TABS: 20.0000 mg | ORAL_TABLET | Freq: Every day | ORAL | 0 refills | Status: DC

## 2020-05-31 ENCOUNTER — Other Ambulatory Visit: Payer: Self-pay | Admitting: Family Medicine

## 2020-05-31 DIAGNOSIS — G4733 Obstructive sleep apnea (adult) (pediatric): Secondary | ICD-10-CM

## 2020-06-14 ENCOUNTER — Encounter: Payer: Self-pay | Admitting: Family Medicine

## 2020-06-15 ENCOUNTER — Other Ambulatory Visit: Payer: Self-pay | Admitting: Family Medicine

## 2020-06-15 DIAGNOSIS — I152 Hypertension secondary to endocrine disorders: Secondary | ICD-10-CM

## 2020-06-15 MED ORDER — LISINOPRIL 40 MG PO TABS: 40.0000 mg | ORAL_TABLET | Freq: Every day | ORAL | 1 refills | Status: DC

## 2020-07-12 ENCOUNTER — Ambulatory Visit (INDEPENDENT_AMBULATORY_CARE_PROVIDER_SITE_OTHER): Payer: Medicare Other | Admitting: Neurology

## 2020-07-12 ENCOUNTER — Encounter: Payer: Self-pay | Admitting: Neurology

## 2020-07-12 VITALS — BP 117/67 | HR 81 | Ht 65.0 in | Wt 181.0 lb

## 2020-07-12 DIAGNOSIS — G4700 Insomnia, unspecified: Secondary | ICD-10-CM

## 2020-07-12 DIAGNOSIS — E669 Obesity, unspecified: Secondary | ICD-10-CM

## 2020-07-12 DIAGNOSIS — G4733 Obstructive sleep apnea (adult) (pediatric): Secondary | ICD-10-CM

## 2020-07-12 DIAGNOSIS — Z820 Family history of epilepsy and other diseases of the nervous system: Secondary | ICD-10-CM | POA: Diagnosis not present

## 2020-07-12 NOTE — Progress Notes (Signed)
Subjective:    Patient ID: Michelle Dyer is a 69 y.o. female.  HPI     Star Age, MD, PhD [facility] [address], [address] [place], [place] [address]  Dear Dr. Zigmund Daniel,  I saw your patient, Michelle Dyer, upon your kind request in my sleep clinic today for initial consultation of her sleep disorder, in particular, evaluation of her prior diagnosis of OSA.  The patient is unaccompanied today.  As you know, Ms. Michelle Dyer is a 69 year old right-handed woman with an underlying medical history of hypertension, hypothyroidism, diabetes, ADD, uterine fibroids and borderline obesity, who was diagnosed with obstructive sleep apnea several years ago.  Prior sleep study results are not available for my review today.  She has not been on CPAP therapy in years, but reports trying CPAP for about a month.  She could not tolerate it at the time.  Sleep study testing was over 10 years ago, she recalls seeing Dr. Annamaria Boots for this.  Her Epworth sleepiness score is 9 out of 24, fatigue severity score is 58 out of 63.  She has chronic difficulty initiating and maintaining sleep.  She may not go to sleep until 3 or 4 AM.  She is often in bed much sooner, watches TV in bed throughout the evening.  She is single, she lives alone, she has no children.  She is retired.  She is a non-smoker and does not drink alcohol on a regular basis and drinks caffeine in the form of coffee, 2 cups/day on average.  She has tried melatonin at night for sleep and also takes tryptophan and an over-the-counter sleep aid at times.  She tries to read to relax.  Her rise time is variable, not until noon and at times.  She has had elevated blood pressure values.  She was recently started on lisinopril 20 mg strength which was then increased to 40 mg daily.  She was also started on Ozempic.  Overall, in the past 10 years she may have lost about 10 pounds compared to when she had her sleep study several  years ago.  In the past, she was diagnosed with polycythemia or erythrocytosis, she does not know exactly but her hemoglobin has been high in the past and she was told to donate blood.  When she recently tried to donate blood, her blood pressure was too high and they would not let her donate blood.  Her latest hemoglobin on 05/22/2020 was 15.8, hematocrit 45.7, both elevated.  She recalls that she did not sleep very well during the sleep study when she was tested several years ago and is not sure that she would be able to sleep in the lab.  She would be willing to do a home sleep test.  Her brother has sleep apnea.  He has a CPAP machine but also had surgery for sleep apnea.  Her difficulty with CPAP was primarily pressure in her ears and ears popping.  She would like to see if she would be a candidate for a different treatment option.  She had heard about the implantable device.  Her Past Medical History Is Significant For: Past Medical History:  Diagnosis Date  . ADD (attention deficit disorder)   . Diabetes mellitus without complication ([place])   . Fatigue   . Genital warts   . Hypertension   . Hypothyroid   . Sleep apnea   . Uterine fibroid     Her Past Surgical History Is Significant For:  Past Surgical History:  Procedure Laterality Date  . GALLBLADDER SURGERY    . MYOMECTOMY  1994    Her Family History Is Significant For: Family History  Problem Relation Age of Onset  . Stroke Mother   . Hypertension Mother   . Kidney disease Mother   . Heart disease Father        pacemaker  . Diabetes Father     Her Social History Is Significant For: Social History   Socioeconomic History  . Marital status: Divorced    Spouse name: Not on file  . Number of children: 0  . Years of education: post grad  . Highest education level: 10th grade  Occupational History  . Occupation: retired  Tobacco Use  . Smoking status: Never Smoker  . Smokeless tobacco: Never Used  Vaping Use  . Vaping  Use: Never used  Substance and Sexual Activity  . Alcohol use: No    Alcohol/week: 0.0 standard drinks    Comment: rarely  . Drug use: No  . Sexual activity: Not Currently    Partners: Male    Birth control/protection: Abstinence  Other Topics Concern  . Not on file  Social History Narrative   Lives alone   Caffeine 2 c daily   Social Determinants of Health   Financial Resource Strain: Not on file  Food Insecurity: Not on file  Transportation Needs: Not on file  Physical Activity: Not on file  Stress: Not on file  Social Connections: Not on file    Her Allergies Are:  No Known Allergies:   Her Current Medications Are:  Outpatient Encounter Medications as of 07/12/2020  Medication Sig  . ARMOUR THYROID 30 MG tablet   . lisinopril (ZESTRIL) 40 MG tablet Take 1 tablet (40 mg total) by mouth daily.  . naltrexone (DEPADE) 50 MG tablet Take 4.5 mg by mouth.  Marland Kitchen OZEMPIC, 1 MG/DOSE, 4 MG/3ML SOPN weekly  . progesterone (PROMETRIUM) 200 MG capsule progesterone micronized 200 mg capsule  . UNKNOWN TO PATIENT T3, 5 mg 1-2 day  . [DISCONTINUED] Naltrexone POWD Take 4.5 mg by mouth at bedtime.  . clobetasol ointment (TEMOVATE) 0.05 % Use a light amount to affected area every other day (Patient not taking: Reported on 07/12/2020)  . liothyronine (CYTOMEL) 5 MCG tablet  (Patient not taking: Reported on 07/12/2020)  . MONOJECT 3CC SYR 27GX1-1/4" 27G X 1-1/4" 3 ML MISC  (Patient not taking: Reported on 07/12/2020)  . Progesterone Micronized (PROGESTERONE, BULK,) POWD  (Patient not taking: Reported on 07/12/2020)   No facility-administered encounter medications on file as of 07/12/2020.  :  Review of Systems:  Out of a complete 14 point review of systems, all are reviewed and negative with the exception of these symptoms as listed below: Review of Systems  Neurological:       Rm 2 New Pt here for sleep consult, prior sleep study, not using CPAP  Epworth Sleepiness Scale 0= would  never doze 1= slight chance of dozing 2= moderate chance of dozing 3= high chance of dozing  Sitting and reading:3 Watching TV:3 Sitting inactive in a public place (ex. Theater or meeting):0 As a passenger in a car for an hour without a break:0 Lying down to rest in the afternoon:2 Sitting and talking to someone:0 Sitting quietly after lunch (no alcohol):1 In a car, while stopped in traffic:0 Total:9    Objective:  Neurological Exam  Physical Exam Physical Examination:   Vitals:   07/12/20 1032  BP: 117/67  Pulse: 81    General Examination: The patient is a very pleasant 69 y.o. female in no acute distress. She appears well-developed and well-nourished and well groomed.   HEENT: Normocephalic, atraumatic, pupils are equal, round and reactive to light, extraocular tracking is good without limitation to gaze excursion or nystagmus noted. Hearing is grossly intact. Face is symmetric with normal facial animation. Speech is clear with no dysarthria noted. There is no hypophonia. There is no lip, neck/head, jaw or voice tremor. Neck is supple with full range of passive and active motion. There are no carotid bruits on auscultation. Oropharynx exam reveals: moderate mouth dryness, adequate dental hygiene and moderate airway crowding, due to smaller airway entry, tonsils in place but on the smaller side,  longer uvula noted, Mallampati class II, neck circumference of 15-1/4 inches.  Tongue protrudes centrally and palate elevates symmetrically.  Chest: Clear to auscultation without wheezing, rhonchi or crackles noted.  Heart: S1+S2+0, regular and normal without murmurs, rubs or gallops noted.   Abdomen: Soft, non-tender and non-distended with normal bowel sounds appreciated on auscultation.  Extremities: There is no pitting edema in the distal lower extremities bilaterally.   Skin: Warm and dry without trophic changes noted.   Musculoskeletal: exam reveals no obvious joint  deformities, tenderness or joint swelling or erythema.   Neurologically:  Mental status: The patient is awake, alert and oriented in all 4 spheres. Her immediate and remote memory, attention, language skills and fund of knowledge are appropriate. There is no evidence of aphasia, agnosia, apraxia or anomia. Speech is clear with normal prosody and enunciation. Thought process is linear. Mood is normal and affect is normal.  Cranial nerves II - XII are as described above under HEENT exam.  Motor exam: Normal bulk, strength and tone is noted. There is no tremor, fine motor skills and coordination: grossly intact.  Cerebellar testing: No dysmetria or intention tremor. There is no truncal or gait ataxia.  Sensory exam: intact to light touch in the upper and lower extremities.  Gait, station and balance: She stands easily. No veering to one side is noted. No leaning to one side is noted. Posture is age-appropriate and stance is narrow based. Gait shows normal stride length and normal pace. No problems turning are noted.   Assessment and Plan:  In summary, VIKKI GAINS is a very pleasant 69 y.o.-year old female with an underlying medical history of hypertension, hypothyroidism, diabetes, ADD, uterine fibroids and borderline obesity, who presents for reevaluation of her obstructive sleep apnea.  She was diagnosed some 10 years ago but could not tolerate CPAP at the time.  She is advised regarding the diagnosis of obstructive sleep apnea, its prognosis and treatment options.   We talked about nonsurgical and surgical options.  She was advised regarding the risks and ramifications of untreated moderate to severe OSA, especially with respect to developing cardiovascular disease down the Road, including congestive heart failure, difficult to treat hypertension, cardiac arrhythmias, or stroke. Even type 2 diabetes has, in part, been linked to untreated OSA. Symptoms of untreated OSA include daytime sleepiness,  memory problems, mood irritability and mood disorder such as depression and anxiety, lack of energy, as well as recurrent headaches, especially morning headaches. We talked about trying to maintain a healthy lifestyle in general, as well as the importance of weight control. We also talked about the importance of good sleep hygiene. I recommended the following at this time: sleep study.  She does not believe she would be able  to sleep during the sleep study in the lab.  She also has a variable sleep schedule and may not fall asleep until the early morning hours.  We mutually agreed to pursue a home sleep test at this time. I explained the sleep test procedure to the patient and also outlined possible surgical and non-surgical treatment options of OSA, including the use of a custom-made dental device (which would require a referral to a specialist dentist or oral surgeon), upper airway surgical options, such as traditional UPPP or a novel less invasive surgical option in the form of Inspire hypoglossal nerve stimulation (which would involve a referral to an ENT surgeon).  I explained to her that the CPAP or AutoPap machines are easier to use, less bulky and that there are more options for interfaces.  We will pick up our discussion after testing and I plan to see her back after her sleep test.  I answered all her questions today and she was in agreement.   Thank you very much for allowing me to participate in the care of this nice patient. If I can be of any further assistance to you please do not hesitate to call me at [phone].  Sincerely,   Star Age, MD, PhD

## 2020-07-12 NOTE — Patient Instructions (Addendum)
Thank you for choosing [facility] for your sleep related care! It was nice to meet you today! I appreciate that you entrust me with your sleep related healthcare concerns. I hope, I was able to address at least some of your concerns today, and that I can help you feel reassured and also get better.    Here is what we discussed today and what we came up with as our plan for you:    Based on your symptoms and your exam I believe you are at risk for obstructive sleep apnea (aka OSA), and I think we should proceed with a sleep study to determine whether you do or do not have OSA and how severe it is. Even, if you have mild OSA, I may want you to consider treatment with CPAP, as treatment of even borderline or mild sleep apnea can result and improvement of symptoms such as sleep disruption, daytime sleepiness, nighttime bathroom breaks, restless leg symptoms, improvement of headache syndromes, even improved mood disorder.   As explained, an attended sleep study meaning you get to stay overnight in the sleep lab, lets [place] monitor sleep-related behaviors such as sleep talking and leg movements in sleep, in addition to monitoring for sleep apnea.  A home sleep test is a screening tool for sleep apnea only, and unfortunately does not help with any other sleep-related diagnoses.  Given your sleep schedule, and difficulty falling asleep during your sleep study in the past, we will go ahead with a home sleep test at your convenience.  Please remember, the long-term risks and ramifications of untreated moderate to severe obstructive sleep apnea are: increased Cardiovascular disease, including congestive heart failure, stroke, difficult to control hypertension, treatment resistant obesity, arrhythmias, especially irregular heartbeat commonly known as A. Fib. (atrial fibrillation); even type 2 diabetes has been linked to untreated OSA.   Sleep apnea can cause disruption of sleep and sleep  deprivation in most cases, which, in turn, can cause recurrent headaches, problems with memory, mood, concentration, focus, and vigilance. Most people with untreated sleep apnea report excessive daytime sleepiness, which can affect their ability to drive. Please do not drive if you feel sleepy. Patients with sleep apnea can also develop difficulty initiating and maintaining sleep (aka insomnia).   Having sleep apnea may increase your risk for other sleep disorders, including involuntary behaviors sleep such as sleep terrors, sleep talking, sleepwalking.    Having sleep apnea can also increase your risk for restless leg syndrome and leg movements at night.   Please note that untreated obstructive sleep apnea may carry additional perioperative morbidity. Patients with significant obstructive sleep apnea (typically, in the moderate to severe degree) should receive, if possible, perioperative PAP (positive airway pressure) therapy and the surgeons and particularly the anesthesiologists should be informed of the diagnosis and the severity of the sleep disordered breathing.   I will likely see you back after your sleep study to go over the test results and where to go from there. We will call you after your sleep study to advise about the results (most likely, you will hear from [facility], my nurse) and to set up an appointment at the time, as necessary.    Our sleep lab administrative assistant will call you to schedule your sleep study and give you further instructions, regarding the check in process for the sleep study, arrival time, what to bring, when you can expect to leave after the study, etc., and to answer any other logistical questions you may  have. If you don't hear back from her by about 2 weeks from now, please feel free to call her direct line at [phone] or you can call our general clinic number, or email [place] through My Chart.

## 2020-07-19 ENCOUNTER — Telehealth: Payer: Self-pay

## 2020-07-19 NOTE — Telephone Encounter (Signed)
Calling to schedule sleep study. 

## 2020-07-23 ENCOUNTER — Telehealth (INDEPENDENT_AMBULATORY_CARE_PROVIDER_SITE_OTHER): Payer: Medicare Other | Admitting: Family Medicine

## 2020-07-23 ENCOUNTER — Telehealth: Payer: Self-pay

## 2020-07-23 DIAGNOSIS — Z91199 Patient's noncompliance with other medical treatment and regimen due to unspecified reason: Secondary | ICD-10-CM

## 2020-07-23 DIAGNOSIS — Z5329 Procedure and treatment not carried out because of patient's decision for other reasons: Secondary | ICD-10-CM

## 2020-07-23 NOTE — Progress Notes (Signed)
Patient called multiple times and text message link to video visit sent.  Patient did not connect for visit.

## 2020-07-23 NOTE — Telephone Encounter (Signed)
LVM for pt to call me back to schedule sleep study  

## 2020-07-23 NOTE — Progress Notes (Signed)
Attempted to contact patient via phone on 3 numbers listed. No answer.

## 2020-07-24 ENCOUNTER — Encounter: Payer: Self-pay | Admitting: Family Medicine

## 2020-07-24 ENCOUNTER — Telehealth (INDEPENDENT_AMBULATORY_CARE_PROVIDER_SITE_OTHER): Payer: Medicare Other | Admitting: Family Medicine

## 2020-07-24 DIAGNOSIS — I152 Hypertension secondary to endocrine disorders: Secondary | ICD-10-CM | POA: Diagnosis not present

## 2020-07-24 DIAGNOSIS — E1159 Type 2 diabetes mellitus with other circulatory complications: Secondary | ICD-10-CM

## 2020-07-24 NOTE — Assessment & Plan Note (Signed)
Had dizziness with increased dose of lisinopril.  Recommend that she check blood pressure to see how readings are looking at home.  She will also come by the clinic for nurse visit to have updated BP and compare to home meter.

## 2020-07-24 NOTE — Progress Notes (Signed)
Michelle Dyer - 70 y.o. female MRN [id number]  Date of birth: 1951-04-30   This visit type was conducted due to national recommendations for restrictions regarding the COVID-19 Pandemic (e.g. social distancing).  This format is felt to be most appropriate for this patient at this time.  All issues noted in this document were discussed and addressed.  No physical exam was performed (except for noted visual exam findings with Video Visits).  I discussed the limitations of evaluation and management by telemedicine and the availability of in person appointments. The patient expressed understanding and agreed to proceed.  I connected with@ on 07/24/20 at  2:20 PM EST by a video enabled telemedicine application and verified that I am speaking with the correct person using two identifiers.  Present at visit: Luetta Nutting, DO [place]   Patient Location: [place] [address]   Provider location:   [facility]  Chief Complaint  Patient presents with  . Hypertension    HPI  Michelle Dyer is a 70 y.o. female who presents via audio/video conferencing for a telehealth visit today.  She is following up today for HTN.  She is currently taking lisinopril at 20mg  daily.  She states that she tried to increase to 40mg  and had dizziness with this.  She has not checked BP at home.  She is currently renovating a home in [place] and left cuff in [place].  She will be back next week.  She did go for sleep evaluation and will have home sleep study.     ROS:  A comprehensive ROS was completed and negative except as noted per HPI  Past Medical History:  Diagnosis Date  . ADD (attention deficit disorder)   . Diabetes mellitus without complication ([place])   . Fatigue   . Genital warts   . Hypertension   . Hypothyroid   . Sleep apnea   . Uterine fibroid     Past Surgical History:  Procedure Laterality Date  . GALLBLADDER SURGERY    . MYOMECTOMY  1994    Family History   Problem Relation Age of Onset  . Stroke Mother   . Hypertension Mother   . Kidney disease Mother   . Heart disease Father        pacemaker  . Diabetes Father     Social History   Socioeconomic History  . Marital status: Divorced    Spouse name: Not on file  . Number of children: 0  . Years of education: post grad  . Highest education level: 10th grade  Occupational History  . Occupation: retired  Tobacco Use  . Smoking status: Never Smoker  . Smokeless tobacco: Never Used  Vaping Use  . Vaping Use: Never used  Substance and Sexual Activity  . Alcohol use: No    Alcohol/week: 0.0 standard drinks    Comment: rarely  . Drug use: No  . Sexual activity: Not Currently    Partners: Male    Birth control/protection: Abstinence  Other Topics Concern  . Not on file  Social History Narrative   Lives alone   Caffeine 2 c daily   Social Determinants of Health   Financial Resource Strain: Not on file  Food Insecurity: Not on file  Transportation Needs: Not on file  Physical Activity: Not on file  Stress: Not on file  Social Connections: Not on file  Intimate Partner Violence: Not on file     Current Outpatient Medications:  .  ARMOUR THYROID 30 MG tablet, , Disp: , Rfl:  .  clobetasol ointment (TEMOVATE) 0.05 %, Use a light amount to affected area every other day (Patient not taking: Reported on 07/12/2020), Disp: 30 g, Rfl: 12 .  liothyronine (CYTOMEL) 5 MCG tablet, , Disp: , Rfl:  .  lisinopril (ZESTRIL) 40 MG tablet, Take 1 tablet (40 mg total) by mouth daily., Disp: 90 tablet, Rfl: 1 .  MONOJECT 3CC SYR 27GX1-1/4" 27G X 1-1/4" 3 ML MISC, , Disp: , Rfl:  .  naltrexone (DEPADE) 50 MG tablet, Take 4.5 mg by mouth., Disp: , Rfl:  .  OZEMPIC, 1 MG/DOSE, 4 MG/3ML SOPN, weekly, Disp: , Rfl:  .  progesterone (PROMETRIUM) 200 MG capsule, progesterone micronized 200 mg capsule, Disp: , Rfl:  .  Progesterone Micronized (PROGESTERONE, BULK,) POWD, , Disp: , Rfl:  .  UNKNOWN TO  PATIENT, T3, 5 mg 1-2 day, Disp: , Rfl:   EXAM:  VITALS per patient if applicable: Wt 181 lb (82.1 kg)   BMI 30.12 kg/m   GENERAL: alert, oriented, appears well and in no acute distress  HEENT: atraumatic, conjunttiva clear, no obvious abnormalities on inspection of external nose and ears  NECK: normal movements of the head and neck  LUNGS: on inspection no signs of respiratory distress, breathing rate appears normal, no obvious gross SOB, gasping or wheezing  CV: no obvious cyanosis  MS: moves all visible extremities without noticeable abnormality  PSYCH/NEURO: pleasant and cooperative, no obvious depression or anxiety, speech and thought processing grossly intact  ASSESSMENT AND PLAN:  Discussed the following assessment and plan:  Hypertension associated with diabetes (HCC) Had dizziness with increased dose of lisinopril.  Recommend that she check blood pressure to see how readings are looking at home.  She will also come by the clinic for nurse visit to have updated BP and compare to home meter.       I discussed the assessment and treatment plan with the patient. The patient was provided an opportunity to ask questions and all were answered. The patient agreed with the plan and demonstrated an understanding of the instructions.   The patient was advised to call back or seek an in-person evaluation if the symptoms worsen or if the condition fails to improve as anticipated.    Luetta Nutting, DO

## 2020-12-06 ENCOUNTER — Other Ambulatory Visit: Payer: Self-pay | Admitting: Family Medicine

## 2020-12-06 DIAGNOSIS — E1159 Type 2 diabetes mellitus with other circulatory complications: Secondary | ICD-10-CM

## 2021-06-02 ENCOUNTER — Other Ambulatory Visit: Payer: Self-pay | Admitting: Family Medicine

## 2021-06-02 DIAGNOSIS — E1159 Type 2 diabetes mellitus with other circulatory complications: Secondary | ICD-10-CM

## 2021-06-02 DIAGNOSIS — I152 Hypertension secondary to endocrine disorders: Secondary | ICD-10-CM

## 2022-03-05 ENCOUNTER — Encounter: Payer: Self-pay | Admitting: General Practice

## 2024-03-01 ENCOUNTER — Other Ambulatory Visit: Payer: Self-pay | Admitting: Medical Genetics

## 2024-05-19 ENCOUNTER — Other Ambulatory Visit (HOSPITAL_COMMUNITY)
Admission: RE | Admit: 2024-05-19 | Discharge: 2024-05-19 | Disposition: A | Discharge status: Home / Self Care | Payer: Self-pay | Source: Ambulatory Visit | Attending: Medical Genetics | Admitting: Medical Genetics

## 2024-05-31 LAB — GENECONNECT MOLECULAR SCREEN: Genetic Analysis Overall Interpretation: NEGATIVE
